# Patient Record
Sex: Male | Born: 1980 | Race: Asian | Hispanic: No | Marital: Married | Smoking: Former smoker
Health system: Southern US, Community
[De-identification: ages and names within clinical notes are randomized; demographics above are authoritative.]

---

## 2017-06-02 ENCOUNTER — Encounter: Payer: Self-pay | Admitting: Urgent Care

## 2017-06-02 ENCOUNTER — Ambulatory Visit (INDEPENDENT_AMBULATORY_CARE_PROVIDER_SITE_OTHER): Payer: BLUE CROSS/BLUE SHIELD

## 2017-06-02 ENCOUNTER — Ambulatory Visit: Payer: BLUE CROSS/BLUE SHIELD | Admitting: Urgent Care

## 2017-06-02 VITALS — BP 130/87 | HR 70 | Temp 97.9°F | Resp 16 | Ht 66.25 in | Wt 169.2 lb

## 2017-06-02 DIAGNOSIS — R0781 Pleurodynia: Secondary | ICD-10-CM

## 2017-06-02 DIAGNOSIS — R05 Cough: Secondary | ICD-10-CM

## 2017-06-02 DIAGNOSIS — R0789 Other chest pain: Secondary | ICD-10-CM

## 2017-06-02 DIAGNOSIS — R059 Cough, unspecified: Secondary | ICD-10-CM

## 2017-06-02 DIAGNOSIS — J209 Acute bronchitis, unspecified: Secondary | ICD-10-CM | POA: Diagnosis not present

## 2017-06-02 MED ORDER — AZITHROMYCIN 250 MG PO TABS: ORAL_TABLET | ORAL | 0 refills | Status: AC

## 2017-06-02 MED ORDER — PREDNISONE 20 MG PO TABS: ORAL_TABLET | ORAL | 0 refills | Status: AC

## 2017-06-02 MED ORDER — ALBUTEROL SULFATE HFA 108 (90 BASE) MCG/ACT IN AERS: 2.0000 | INHALATION_SPRAY | Freq: Four times a day (QID) | RESPIRATORY_TRACT | 1 refills | Status: AC | PRN

## 2017-06-02 MED ORDER — CETIRIZINE HCL 10 MG PO TABS: 10.0000 mg | ORAL_TABLET | Freq: Every day | ORAL | 11 refills | Status: AC

## 2017-06-02 NOTE — Progress Notes (Signed)
    MRN: [id number] DOB: Feb 10, 1981  Subjective:   Timothy Shelton is a 36 y.o. male presenting for chief complaint of Cough (x3 weeks) and Chest Pain (from the coughing; states his lungs hurt)  Reports 3 week history dry cough that elicits chest pain, pain with breathing deeply, subjective fever. Also has had sore throat. Has tried taking medications for colds. Denies sinus pain, sinus congestion, ear pain, shob, n/v, abdominal pain. Denies smoking cigarettes. Quit smoking, used to do 1ppd, quit in 2010. Has a history of allergies, manages with Zyrtec but has not taken it for weeks.  Timothy Shelton is not currently taking any medications and has No Known Allergies.  Timothy Shelton denies past medical and surgical history.   Objective:   Vitals: BP 130/87   Pulse 70   Temp 97.9 F (36.6 C) (Oral)   Resp 16   Ht 5' 6.25" (1.683 m)   Wt 169 lb 3.2 oz (76.7 kg)   SpO2 96%   BMI 27.10 kg/m   Physical Exam  Constitutional: He is oriented to person, place, and time. He appears well-developed and well-nourished.  HENT:  Mouth/Throat: Oropharynx is clear and moist.  Cardiovascular: Normal rate, regular rhythm and intact distal pulses. Exam reveals no gallop and no friction rub.  No murmur heard. Pulmonary/Chest: No accessory muscle usage or stridor. No tachypnea. No respiratory distress. He has no wheezes. He has rhonchi (diffuse throughout, worse over mid-lower lung fields). He has no rales.  Neurological: He is alert and oriented to person, place, and time.  Skin: Skin is warm and dry.  Psychiatric: He has a normal mood and affect.   Dg Chest 2 View  Result Date: 06/02/2017 CLINICAL DATA:  Cough congestion EXAM: CHEST  2 VIEW COMPARISON:  None. FINDINGS: The heart size and mediastinal contours are within normal limits. Both lungs are clear. The visualized skeletal structures are unremarkable. IMPRESSION: No active cardiopulmonary disease. Electronically Signed   By: Alcide CleverMark  Lukens M.D.   On: 06/02/2017 10:10    Assessment and Plan :   1. Acute bronchitis, unspecified organism 2. Cough 3. Atypical chest pain 4. Pleuritic pain - Start azithromycin, short steroid course, schedule albuterol inhaler. Restart Zyrtec. Return-to-clinic precautions discussed, patient verbalized understanding.   Wallis BambergMario Havannah Streat, PA-C [facility] [phone] 06/02/2017  9:51 AM

## 2017-06-02 NOTE — Patient Instructions (Addendum)
Acute Bronchitis, Adult Acute bronchitis is sudden (acute) swelling of the air tubes (bronchi) in the lungs. Acute bronchitis causes these tubes to fill with mucus, which can make it hard to breathe. It can also cause coughing or wheezing. In adults, acute bronchitis usually goes away within 2 weeks. A cough caused by bronchitis may last up to 3 weeks. Smoking, allergies, and asthma can make the condition worse. Repeated episodes of bronchitis may cause further lung problems, such as chronic obstructive pulmonary disease (COPD). What are the causes? This condition can be caused by germs and by substances that irritate the lungs, including:  Cold and flu viruses. This condition is most often caused by the same virus that causes a cold.  Bacteria.  Exposure to tobacco smoke, dust, fumes, and air pollution. What increases the risk? This condition is more likely to develop in people who:  Have close contact with someone with acute bronchitis.  Are exposed to lung irritants, such as tobacco smoke, dust, fumes, and vapors.  Have a weak immune system.  Have a respiratory condition such as asthma. What are the signs or symptoms? Symptoms of this condition include:  A cough.  Coughing up clear, yellow, or green mucus.  Wheezing.  Chest congestion.  Shortness of breath.  A fever.  Body aches.  Chills.  A sore throat. How is this diagnosed? This condition is usually diagnosed with a physical exam. During the exam, your health care provider may order tests, such as chest X-rays, to rule out other conditions. He or she may also:  Test a sample of your mucus for bacterial infection.  Check the level of oxygen in your blood. This is done to check for pneumonia.  Do a chest X-ray or lung function testing to rule out pneumonia and other conditions.  Perform blood tests. Your health care provider will also ask about your symptoms and medical history. How is this treated? Most cases  of acute bronchitis clear up over time without treatment. Your health care provider may recommend:  Drinking more fluids. Drinking more makes your mucus thinner, which may make it easier to breathe.  Taking a medicine for a fever or cough.  Taking an antibiotic medicine.  Using an inhaler to help improve shortness of breath and to control a cough.  Using a cool mist vaporizer or humidifier to make it easier to breathe. Follow these instructions at home: Medicines   Take over-the-counter and prescription medicines only as told by your health care provider.  If you were prescribed an antibiotic, take it as told by your health care provider. Do not stop taking the antibiotic even if you start to feel better. General instructions   Get plenty of rest.  Drink enough fluids to keep your urine clear or pale yellow.  Avoid smoking and secondhand smoke. Exposure to cigarette smoke or irritating chemicals will make bronchitis worse. If you smoke and you need help quitting, ask your health care provider. Quitting smoking will help your lungs heal faster.  Use an inhaler, cool mist vaporizer, or humidifier as told by your health care provider.  Keep all follow-up visits as told by your health care provider. This is important. How is this prevented? To lower your risk of getting this condition again:  Wash your hands often with soap and water. If soap and water are not available, use hand sanitizer.  Avoid contact with people who have cold symptoms.  Try not to touch your hands to your mouth, nose, or   eyes.  Make sure to get the flu shot every year. Contact a health care provider if:  Your symptoms do not improve in 2 weeks of treatment. Get help right away if:  You cough up blood.  You have chest pain.  You have severe shortness of breath.  You become dehydrated.  You faint or keep feeling like you are going to faint.  You keep vomiting.  You have a severe headache.  Your  fever or chills gets worse. This information is not intended to replace advice given to you by your health care provider. Make sure you discuss any questions you have with your health care provider. Document Released: 08/18/2004 Document Revised: 02/03/2016 Document Reviewed: 12/30/2015 Elsevier Interactive Patient Education  2017 [employer]     IF you received an x-ray today, you will receive an invoice from [facility]. Please contact [facility] at [phone] with questions or concerns regarding your invoice.   IF you received labwork today, you will receive an invoice from [facility]. Please contact [facility] at [phone] with questions or concerns regarding your invoice.   Our billing staff will not be able to assist you with questions regarding bills from these companies.  You will be contacted with the lab results as soon as they are available. The fastest way to get your results is to activate your My Chart account. Instructions are located on the last page of this paperwork. If you have not heard from us regarding the results in 2 weeks, please contact this office.     

## 2018-07-17 IMAGING — DX DG CHEST 2V
2 series · 2 of 2 positions shown · non-contrast
Comparison: None.

CLINICAL DATA: Cough congestion

EXAM:
CHEST  2 VIEW

[chest pa]
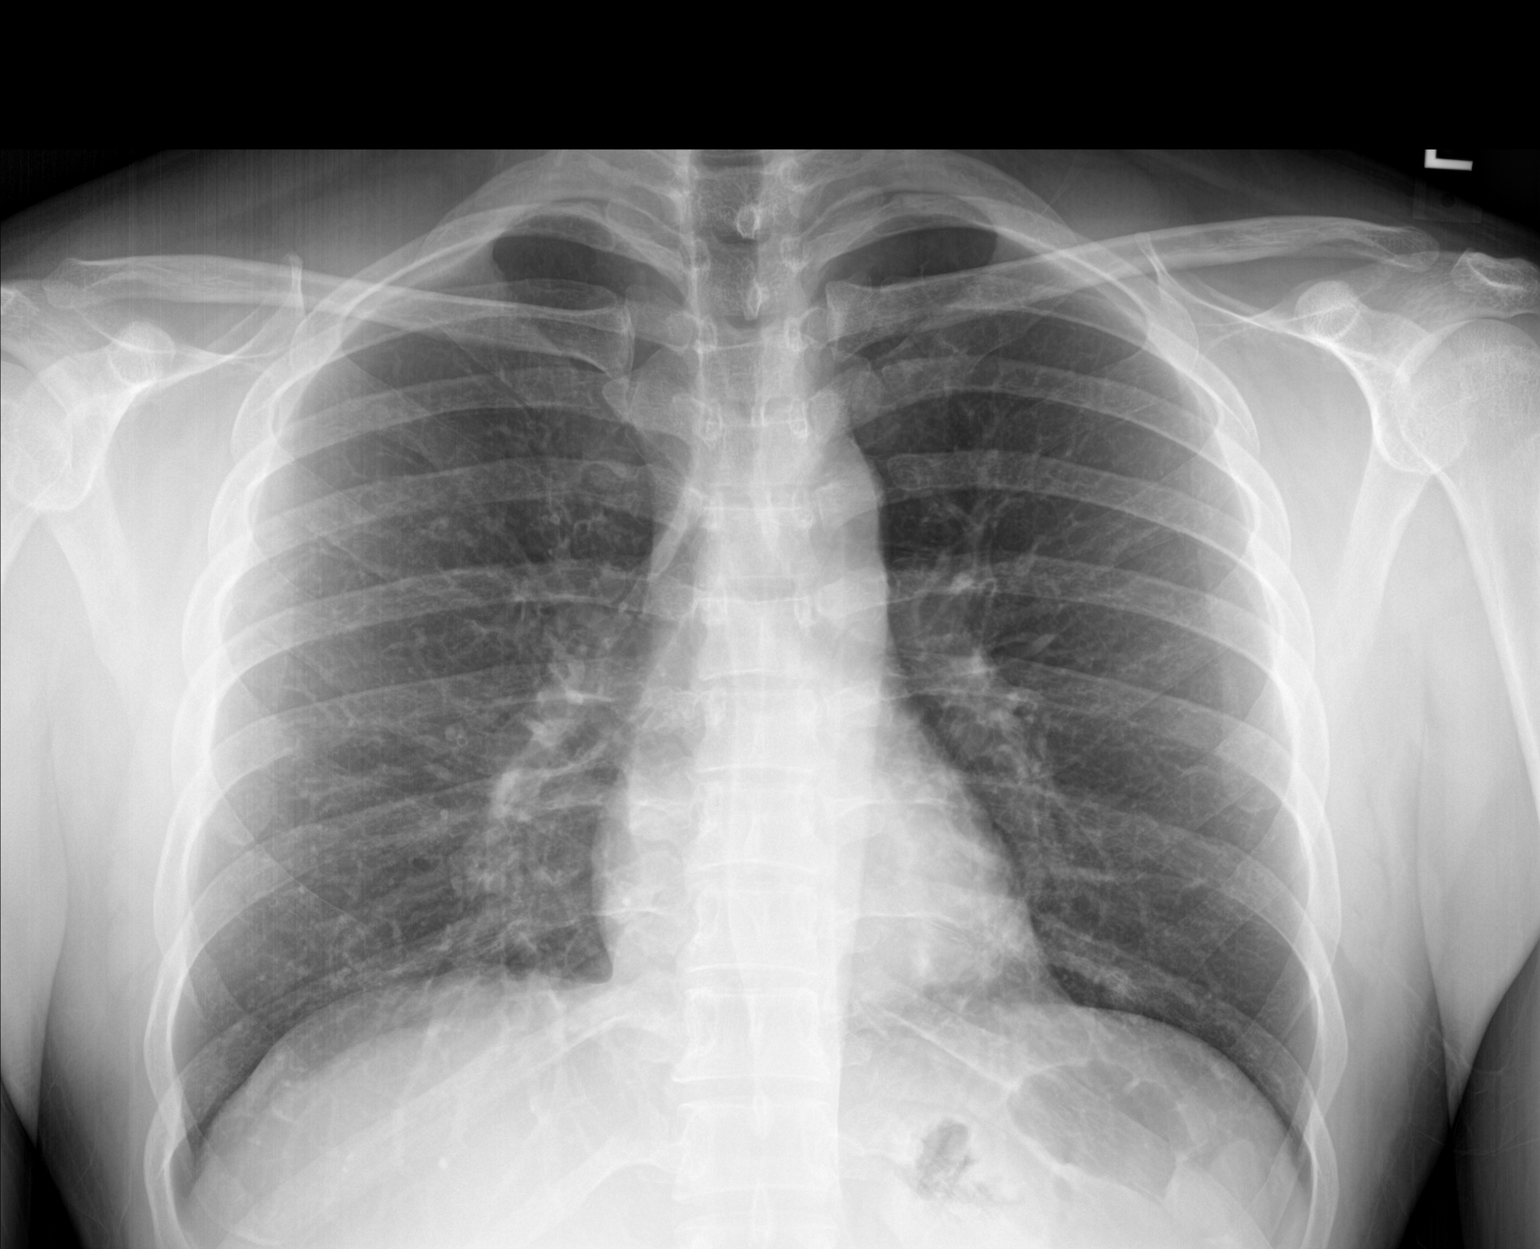

[chest lat]
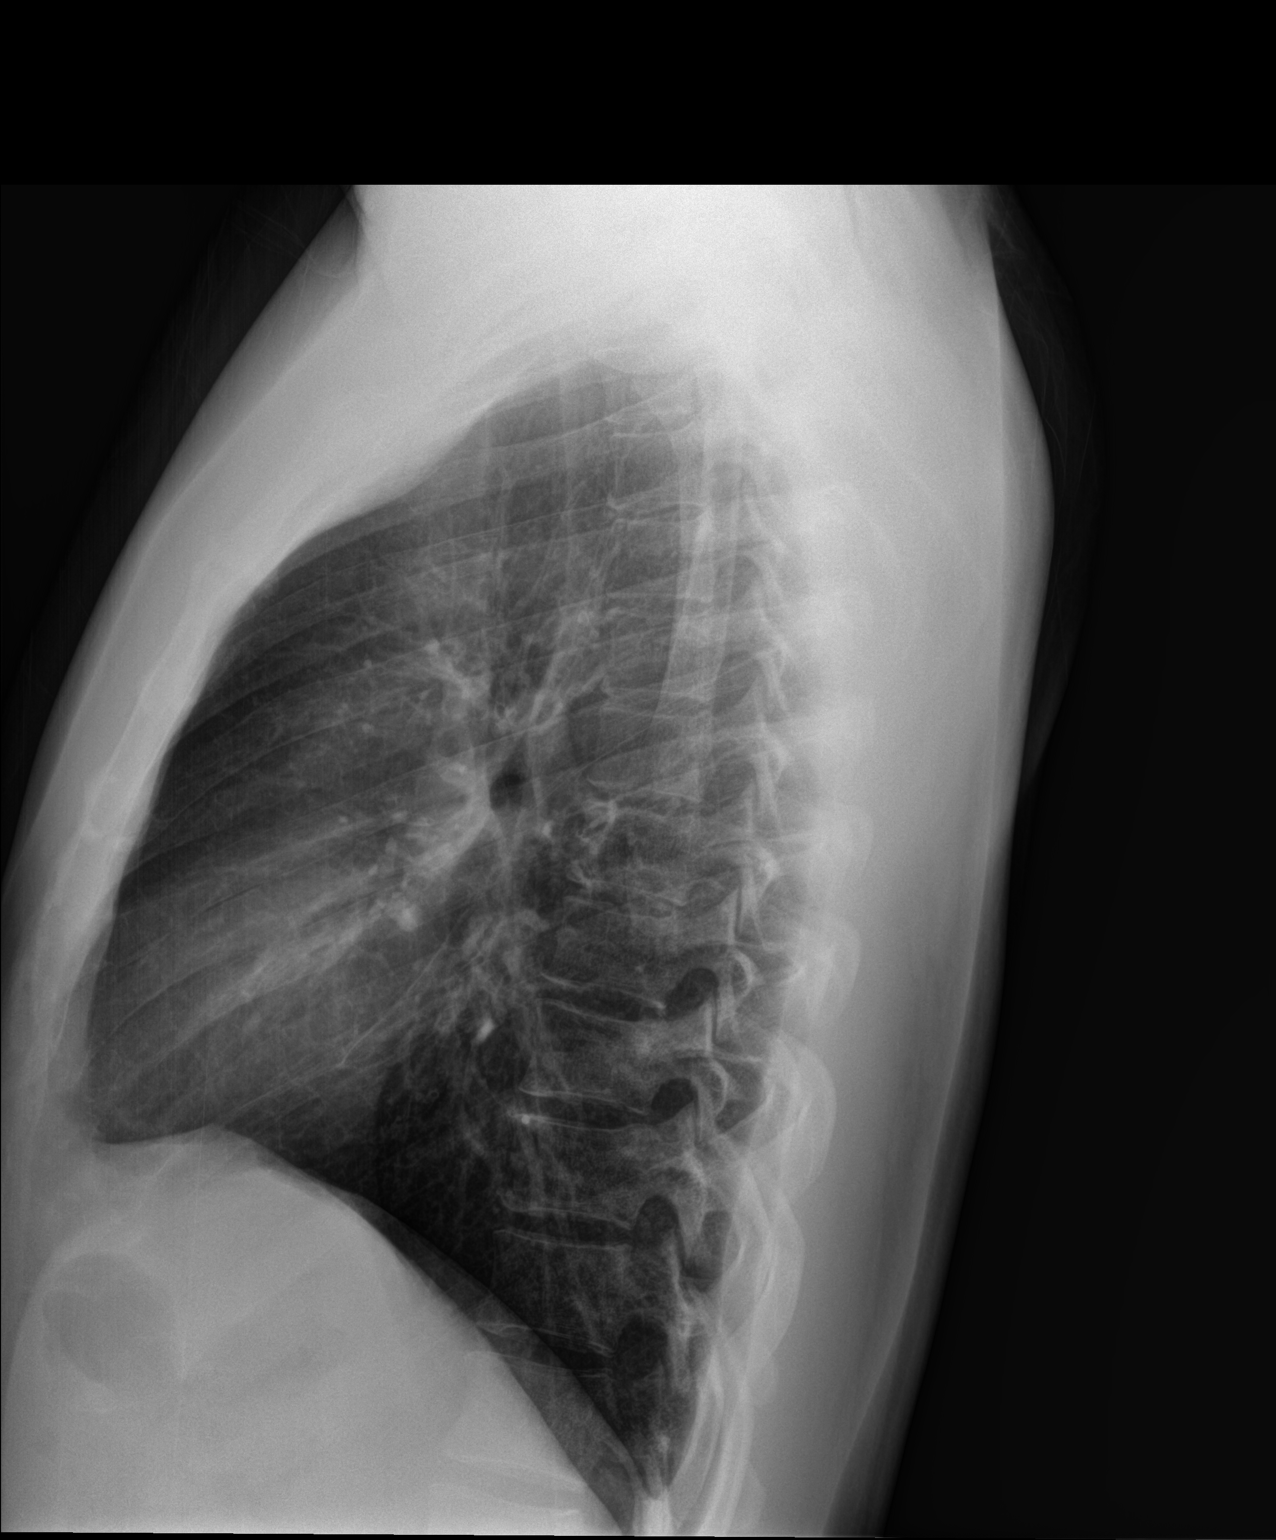

[2 of 2 positions shown; findings below may reference images not displayed]

FINDINGS: The heart size and mediastinal contours are within normal limits.
Both lungs are clear. The visualized skeletal structures are
unremarkable.
IMPRESSION: No active cardiopulmonary disease.
